# Patient Record
Sex: Male | Born: 1995 | Race: Black or African American | Hispanic: No | Marital: Single | Smoking: Never smoker
Health system: Southern US, Community
[De-identification: ages and names within clinical notes are randomized; demographics above are authoritative.]

---
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
## 2019-06-24 ENCOUNTER — Ambulatory Visit
Admission: EM | Admit: 2019-06-24 | Discharge: 2019-06-24 | Disposition: A | Discharge status: Home / Self Care | Payer: PRIVATE HEALTH INSURANCE | Attending: Family Medicine | Admitting: Family Medicine

## 2019-06-24 ENCOUNTER — Other Ambulatory Visit: Payer: Self-pay

## 2019-06-24 ENCOUNTER — Encounter: Payer: Self-pay | Admitting: Emergency Medicine

## 2019-06-24 ENCOUNTER — Ambulatory Visit (INDEPENDENT_AMBULATORY_CARE_PROVIDER_SITE_OTHER): Payer: PRIVATE HEALTH INSURANCE

## 2019-06-24 DIAGNOSIS — S161XXA Strain of muscle, fascia and tendon at neck level, initial encounter: Secondary | ICD-10-CM | POA: Diagnosis not present

## 2019-06-24 DIAGNOSIS — M545 Low back pain: Secondary | ICD-10-CM | POA: Diagnosis not present

## 2019-06-24 DIAGNOSIS — S39012A Strain of muscle, fascia and tendon of lower back, initial encounter: Secondary | ICD-10-CM | POA: Diagnosis not present

## 2019-06-24 MED ORDER — CYCLOBENZAPRINE HCL 10 MG PO TABS: 10.0000 mg | ORAL_TABLET | Freq: Every day | ORAL | 0 refills | Status: AC

## 2019-06-24 MED ORDER — MELOXICAM 15 MG PO TABS: 15.0000 mg | ORAL_TABLET | Freq: Every day | ORAL | 0 refills | Status: AC

## 2019-06-24 MED ORDER — HYDROCODONE-ACETAMINOPHEN 5-325 MG PO TABS: ORAL_TABLET | ORAL | 0 refills | Status: AC
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
## 2019-06-24 NOTE — Discharge Instructions (Signed)
Rest, heat, over the counter tylenol as needed 
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
## 2019-10-13 ENCOUNTER — Ambulatory Visit: Payer: PRIVATE HEALTH INSURANCE | Attending: Internal Medicine
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
